# Patient Record
Sex: Male | Born: 2000 | Race: White | Hispanic: No | Marital: Single | Smoking: Never smoker
Health system: Southern US, Community
[De-identification: ages and names within clinical notes are randomized; demographics above are authoritative.]

---
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
## 2022-04-11 ENCOUNTER — Ambulatory Visit: Payer: BC Managed Care – PPO | Admitting: Family Medicine

## 2022-04-11 ENCOUNTER — Encounter: Payer: Self-pay | Admitting: Family Medicine

## 2022-04-11 VITALS — BP 110/78 | HR 78 | Temp 97.6°F | Ht 67.5 in | Wt 134.2 lb

## 2022-04-11 DIAGNOSIS — F1210 Cannabis abuse, uncomplicated: Secondary | ICD-10-CM

## 2022-04-11 DIAGNOSIS — F321 Major depressive disorder, single episode, moderate: Secondary | ICD-10-CM | POA: Diagnosis not present

## 2022-04-11 DIAGNOSIS — R197 Diarrhea, unspecified: Secondary | ICD-10-CM | POA: Insufficient documentation

## 2022-04-11 DIAGNOSIS — R45851 Suicidal ideations: Secondary | ICD-10-CM | POA: Diagnosis not present

## 2022-04-11 DIAGNOSIS — M0840 Pauciarticular juvenile rheumatoid arthritis, unspecified site: Secondary | ICD-10-CM

## 2022-04-11 DIAGNOSIS — F909 Attention-deficit hyperactivity disorder, unspecified type: Secondary | ICD-10-CM | POA: Diagnosis not present

## 2022-04-11 NOTE — Assessment & Plan Note (Signed)
Intermittant, with some weight loss No other red flag symptoms Cannot rule out depression as cause of weight loss Recommend patient follow up in 3 months to check weight and assess symptoms

## 2022-04-11 NOTE — Assessment & Plan Note (Signed)
Passive Safety discussed with father  I provided these resources: - National suicide hotline (703) 227-1089) - [place] (1-800-SUICIDE)  Reminded patient our clinic has doctors available for phone contact 24/7  Offered referral to behavioral health, however patient's sister sees a therapist, he wishes to establish with them and declined referral at this time    Father present and says that he will continue to monitor patient and help coordinate care.

## 2022-04-11 NOTE — Assessment & Plan Note (Signed)
Planning to establish with therapy Follow up in 3 montsh

## 2022-04-11 NOTE — Patient Instructions (Signed)
When you're going through tough times, it's easy to feel lonely and overwhelmed. Remember that YOU ARE NOT ALONE and we at [facility] want to support you during this difficult time.  There are many ways to get help and support when you are ready.  You can call the following help lines: - National suicide hotline (386)246-2244) - [place] (1-800-SUICIDE)  You can schedule an appointment with your primary care doc or one of our counselors.  We are all here to support you.  A doctor is on call 24/7, so call us if you need to at [phone]).  There are many treatments that can help people during difficult times, and we can talk with you about medications, counseling, diet, and other options. We want to offer you HOPE that it won't always feel this bad.  If you are seriously thinking about hurting yourself or have a plan, please call 911 or go to any emergency room right away for immediate help.

## 2022-04-11 NOTE — Progress Notes (Signed)
Assessment/Plan:  Spent 46 minutes reviewing patient's chart and discussing care plan with patient and father Problem List Items Addressed This Visit       Musculoskeletal and Integument   JIA (juvenile idiopathic arthritis), oligoarthritis, persistent (HCC)     Other   Depression, major, single episode, moderate ([place])    Planning to establish with therapy Follow up in 3 montsh      Suicidal ideation    Passive Safety discussed with father  I provided these resources: - National suicide hotline 912-283-0301) - [place] (1-800-SUICIDE)  Reminded patient our clinic has doctors available for phone contact 24/7  Offered referral to behavioral health, however patient's sister sees a therapist, he wishes to establish with them and declined referral at this time    Father present and says that he will continue to monitor patient and help coordinate care.       Diarrhea    Intermittant, with some weight loss No other red flag symptoms Cannot rule out depression as cause of weight loss Recommend patient follow up in 3 months to check weight and assess symptoms       Other Visit Diagnoses     Tetrahydrocannabinol (THC) use disorder, mild, abuse    -  Primary   Attention deficit hyperactivity disorder (ADHD), unspecified ADHD type              Subjective:  HPI:  Dominic Cowan is a 21 y.o. male who has JIA (juvenile idiopathic arthritis), oligoarthritis, persistent ([place]); High risk medication use; Depression, major, single episode, moderate ([place]); Suicidal ideation; and Diarrhea on their problem list..   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 Patient also gets constant cramps as well. ) .  Patient is here with father.  Both patient and son provide history. Patient has history of JIA.  Has  been evaluated by pediatric rheumatology.  Last seen by [facility] in 2022.  He has been on methotrexate and other immune modulators in the past.  He has not had a flare in over a year.  He has not been on any medication during this time.  Does not have any ongoing rashes, joint pain, fevers at this time. Lost 11 lbs over past 3 months,   Patient reports that he has been outside at a lot recently.  Most recently pressure.  The event was very hot.  Patient did try to eat and drink appropriately.  However he did develop some lower leg cramps.  He has not going at the moment.  These are worse when he is outside in the heat.   Father relates that patient has longstanding history of history of stomach issues.and mostly presents as intermittent diarrhea when eating. Patient does tolerated p.o. well.  Although father reports him as a lifelong "light" eater .  At this time he does not have any abdominal pain, no nausea vomiting, very active,   Patient has history of ADHD and what he believes is undiagnosed bipolar.  He does report some Anhydonia.and not wanting to do things as much as he used to.  Father does report that he does not bathe daily.  But he will try himself places.       04/11/2022    1:46 PM  Depression screen PHQ 2/9  Decreased Interest 1  Down, Depressed, Hopeless 1  PHQ - 2 Score 2  Altered sleeping 0  Tired, decreased energy 1  Change in appetite 0  Feeling bad or failure about yourself  2  Trouble concentrating 1  Moving slowly or fidgety/restless 1  Suicidal thoughts 2  PHQ-9 Score 9  Difficult doing work/chores Somewhat difficult      04/11/2022    1:50 PM  GAD 7 : Generalized Anxiety Score  Nervous, Anxious, on Edge 1  Control/stop worrying 1  Worry too much - different things 0  Trouble relaxing 1  Restless 0  Easily annoyed or irritable 1  Afraid - awful might happen 3  Total GAD 7 Score 7  Anxiety Difficulty Not difficult at all   Suicide Assessment  Plan: - How  specific is the plan: no plan - How lethal are the means: gun, father reports that he will confiscated firearm and put it in a locked gun case for patient does not have access - Does the patient have access to the means: fire arm  - Does the patient have social support: friends and family    Protective factors (what has kept the patient from self-harm thus far): Family and girlfriend  Substance use / abuse:  THC vaping, 62m cart 1.5 month, some alcohol use, reports that its minor such as wine coolers  Presence of hallucinations / delusions: none  History of SI / Attempts: none  Family history of attempted or completed suicide: none  Duration and Intensity of SI:  1 year, initially About a Year Ago , then had a long interlude several months without any thoughts, then they returned over the past Few Weeks  History of prior psychiatric hospitalizations: none  Chart review for additional risk factors (cite chronic pain, insomnia, panic attacks, age, gender, if present):   Coping mechanisms: THC   How likely are you to act on these thoughts of hurting yourself or ending your life sometime over the next month? NOT LIKELY AT ALL  If somewhat or very likely, consider hospitalization.  Consult suicide protocol if you have not yet done so.  Based on this assessment:    I provided these resources: - National suicide hotline ((518) 549-3326 - [employer](1-800-SUICIDE)  Reminded patient our clinic has doctors available for phone contact 24/7  Offered referral to behavioral health, however patient's sister sees a therapist, he wishes to establish with them and declined referral at this time    Father present and says that he will continue to monitor patient and help coordinate care.   Patient states that he has a history of ADHD.  He was diagno he is not currently medicated at this time.  Sed as a child.  He has been medicated in the past but methylphenidate and  Adderall.however these were discontinued due to significant mood swings and appetite suppression.  History reviewed. No pertinent surgical history.  No outpatient medications prior to visit.   No facility-administered medications prior to visit.    Family History  Problem Relation Age of Onset   Hypertension Mother     Social History   Socioeconomic History   Marital status: Single    Spouse name: Not on file   Number of children: Not on file   Years of education: Not on file   Highest education level: Not on file  Occupational History   Not on file  Tobacco Use   Smoking status: Never    Passive exposure: Never   Smokeless tobacco: Never  Vaping Use  Vaping Use: Some days  Substance and Sexual Activity   Alcohol use: Never   Drug use: Never   Sexual activity: Not on file  Other Topics Concern   Not on file  Social History Narrative   Not on file   Social Determinants of Health   Financial Resource Strain: Not on file  Food Insecurity: Not on file  Transportation Needs: Not on file  Physical Activity: Not on file  Stress: Not on file  Social Connections: Not on file  Intimate Partner Violence: Not on file                                                                                                 Objective:  Physical Exam: BP 110/78 (BP Location: Left Arm, Patient Position: Sitting, Cuff Size: Large)   Pulse 78   Temp 97.6 F (36.4 C) (Temporal)   Ht 5' 7.5" (1.715 m)   Wt 134 lb 3.2 oz (60.9 kg)   BMI 20.71 kg/m    General: No acute distress. Awake and conversant.  Eyes: Normal conjunctiva, anicteric. Round symmetric pupils.  ENT: Hearing grossly intact. No nasal discharge.  Neck: Neck is supple. No masses or thyromegaly.  Respiratory: Respirations are non-labored. No auditory wheezing.  Skin: Warm. No rashes or ulcers.  Psych: Alert and oriented. Cooperative,  Normal judgment.  CV: No cyanosis or JVD MSK: Normal ambulation. No clubbing   Neuro: Sensation and CN II-XII grossly normal.        Alesia Banda, MD, MS

## 2022-04-14 ENCOUNTER — Telehealth: Payer: Self-pay | Admitting: Family Medicine

## 2022-04-14 DIAGNOSIS — F321 Major depressive disorder, single episode, moderate: Secondary | ICD-10-CM

## 2022-04-14 NOTE — Telephone Encounter (Signed)
Contacted Elder Negus, patient's father, he would like a referral for a therapist for [place] stated the therapist they had in mind didn't work out.

## 2022-04-14 NOTE — Telephone Encounter (Signed)
Dominic Cowan about the referral process and that someone from that office will contact them about scheduling.

## 2022-04-14 NOTE — Telephone Encounter (Signed)
Pt  father would like you to call him @ [phone] and also would like you to get pt set up to see a therapist that ya'll talked about

## 2022-07-14 ENCOUNTER — Ambulatory Visit: Payer: BC Managed Care – PPO | Admitting: Family Medicine

## 2022-07-14 ENCOUNTER — Encounter: Payer: Self-pay | Admitting: Family Medicine

## 2022-07-14 ENCOUNTER — Ambulatory Visit (INDEPENDENT_AMBULATORY_CARE_PROVIDER_SITE_OTHER): Payer: BC Managed Care – PPO

## 2022-07-14 VITALS — BP 114/74 | HR 77 | Temp 97.0°F | Wt 128.0 lb

## 2022-07-14 DIAGNOSIS — R110 Nausea: Secondary | ICD-10-CM | POA: Diagnosis not present

## 2022-07-14 DIAGNOSIS — E162 Hypoglycemia, unspecified: Secondary | ICD-10-CM

## 2022-07-14 DIAGNOSIS — R634 Abnormal weight loss: Secondary | ICD-10-CM | POA: Diagnosis not present

## 2022-07-14 DIAGNOSIS — M0840 Pauciarticular juvenile rheumatoid arthritis, unspecified site: Secondary | ICD-10-CM | POA: Diagnosis not present

## 2022-07-14 DIAGNOSIS — R197 Diarrhea, unspecified: Secondary | ICD-10-CM

## 2022-07-14 DIAGNOSIS — F321 Major depressive disorder, single episode, moderate: Secondary | ICD-10-CM

## 2022-07-14 DIAGNOSIS — R45851 Suicidal ideations: Secondary | ICD-10-CM

## 2022-07-14 DIAGNOSIS — E876 Hypokalemia: Secondary | ICD-10-CM

## 2022-07-14 LAB — COMPREHENSIVE METABOLIC PANEL
ALT: 12 U/L (ref 0–53)
AST: 13 U/L (ref 0–37)
Albumin: 4.9 g/dL (ref 3.5–5.2)
Alkaline Phosphatase: 63 U/L (ref 39–117)
BUN: 13 mg/dL (ref 6–23)
CO2: 28 mEq/L (ref 19–32)
Calcium: 9.7 mg/dL (ref 8.4–10.5)
Chloride: 103 mEq/L (ref 96–112)
Creatinine, Ser: 0.77 mg/dL (ref 0.40–1.50)
GFR: 129.16 mL/min (ref >60.00)
Glucose, Bld: 61 mg/dL — ABNORMAL LOW (ref 70–99)
Potassium: 3.3 mEq/L — ABNORMAL LOW (ref 3.5–5.1)
Sodium: 140 mEq/L (ref 135–145)
Total Bilirubin: 0.6 mg/dL (ref 0.2–1.2)
Total Protein: 7.8 g/dL (ref 6.0–8.3)

## 2022-07-14 LAB — CBC WITH DIFFERENTIAL/PLATELET
Basophils Absolute: 0.1 10*3/uL (ref 0.0–0.1)
Basophils Relative: 1.9 % (ref 0.0–3.0)
Eosinophils Absolute: 0.1 10*3/uL (ref 0.0–0.7)
Eosinophils Relative: 1.4 % (ref 0.0–5.0)
HCT: 43.6 % (ref 39.0–52.0)
Hemoglobin: 15.1 g/dL (ref 13.0–17.0)
Lymphocytes Relative: 32.6 % (ref 12.0–46.0)
Lymphs Abs: 1.2 10*3/uL (ref 0.7–4.0)
MCHC: 34.7 g/dL (ref 30.0–36.0)
MCV: 88.8 fl (ref 78.0–100.0)
Monocytes Absolute: 0.4 10*3/uL (ref 0.1–1.0)
Monocytes Relative: 11.6 % (ref 3.0–12.0)
Neutro Abs: 2 10*3/uL (ref 1.4–7.7)
Neutrophils Relative %: 52.5 % (ref 43.0–77.0)
Platelets: 211 10*3/uL (ref 150.0–400.0)
RBC: 4.91 Mil/uL (ref 4.22–5.81)
RDW: 12.8 % (ref 11.5–14.6)
WBC: 3.7 10*3/uL — ABNORMAL LOW (ref 4.5–10.5)

## 2022-07-14 LAB — SEDIMENTATION RATE: Sed Rate: 3 mm/hr (ref 0–15)

## 2022-07-14 LAB — TSH: TSH: 0.95 u[IU]/mL (ref 0.35–5.50)

## 2022-07-14 LAB — C-REACTIVE PROTEIN: CRP: <1 mg/dL (ref 0.5–20.0)

## 2022-07-14 MED ORDER — ESCITALOPRAM OXALATE 10 MG PO TABS: 5.0000 mg | ORAL_TABLET | Freq: Every day | ORAL | 0 refills | Status: DC

## 2022-07-14 MED ORDER — BOOST VERY HIGH CALORIE PO LIQD: 1.0000 | Freq: Every day | ORAL | Status: AC

## 2022-07-14 NOTE — Assessment & Plan Note (Signed)
Unintentional weight loss in the setting of depression and ongoing diarrhea Possibly multifactorial with mood and/or underlying physiologic process with co-occurring Of note, diarrhea is improving, but did initiate with exposure to horse at a farm show.  Possibly indicating infectious etiology. Patient also has a history of JIA, currently asymptomatic, but could have associated underlying inflammatory processes or co-occurring IBD Plan to treat depression with escitalopram low-dose 5 mg Will work-up for organic causes of weight loss including endocrine dysfunction, inflammatory causes, infectious/GI etiology Will refer to gastroenterology given ongoing symptoms with diarrhea and weight loss Encourage meal supplementation with high calorie shakes Return in about 4 weeks (around 08/11/2022) for depression and anxiety and weight loss.

## 2022-07-14 NOTE — Assessment & Plan Note (Signed)
Associated with GAD Reports better, especially regarding passive suicidal ideation , But PHQ-9/GAD-7 are both elevated Patient denies any medication history regarding anxiety or depression. Encourage patient follow-up with counseling, Will try low-dose escitalopram

## 2022-07-14 NOTE — Progress Notes (Signed)
Assessment/Plan:   Problem List Items Addressed This Visit       Musculoskeletal and Integument   JIA (juvenile idiopathic arthritis), oligoarthritis, persistent (HCC)   Relevant Orders   Sedimentation rate   C-reactive protein     Other   Depression, major, single episode, moderate (HCC)    Associated with GAD Reports better, especially regarding passive suicidal ideation , But PHQ-9/GAD-7 are both elevated Patient denies any medication history regarding anxiety or depression. Encourage patient follow-up with counseling, Will try low-dose escitalopram       Relevant Medications   escitalopram (LEXAPRO) 10 MG tablet   Suicidal ideation   Relevant Medications   escitalopram (LEXAPRO) 10 MG tablet   Diarrhea   Relevant Orders   TSH   CBC w/Diff   Sedimentation rate   C-reactive protein   Comp Met (CMET)   Gliadin antibodies, serum   Tissue transglutaminase, IgA   Reticulin Antibody, IgA w reflex titer   Ambulatory referral to Gastroenterology   Fecal occult blood, imunochemical   GI Profile, Stool, PCR   Unintentional weight loss - Primary    Unintentional weight loss in the setting of depression and ongoing diarrhea Possibly multifactorial with mood and/or underlying physiologic process with co-occurring Of note, diarrhea is improving, but did initiate with exposure to horse at a farm show.  Possibly indicating infectious etiology. Patient also has a history of JIA, currently asymptomatic, but could have associated underlying inflammatory processes or co-occurring IBD Plan to treat depression with escitalopram low-dose 5 mg Will work-up for organic causes of weight loss including endocrine dysfunction, inflammatory causes, infectious/GI etiology Will refer to gastroenterology given ongoing symptoms with diarrhea and weight loss Encourage meal supplementation with high calorie shakes Return in about 4 weeks (around 08/11/2022) for depression and anxiety and weight  loss.       Relevant Medications   Nutritional Supplements (BOOST VERY HIGH CALORIE) LIQD   Other Relevant Orders   TSH   CBC w/Diff   Sedimentation rate   C-reactive protein   Urinalysis, Routine w reflex microscopic   Comp Met (CMET)   Gliadin antibodies, serum   Tissue transglutaminase, IgA   Reticulin Antibody, IgA w reflex titer   Ambulatory referral to Gastroenterology   HIV antibody (with reflex)   DG Chest 2 View   Fecal occult blood, imunochemical   GI Profile, Stool, PCR   Lactate dehydrogenase   Other Visit Diagnoses     Nausea       Relevant Orders   Comp Met (CMET)   Gliadin antibodies, serum   Fecal occult blood, imunochemical          Subjective:  HPI:  Dominic Cowan is a 21 y.o. male who has JIA (juvenile idiopathic arthritis), oligoarthritis, persistent ([place]); Depression, major, single episode, moderate ([place]); Suicidal ideation; Diarrhea; and Unintentional weight loss on their problem list..   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 Cramping has gotten better. ) .   Patient's father is with him here who provides concern for ongoing mental health issues and weight loss.  Unintentional weight loss.  Patient reports that he is at ongoing weight loss at home.  He has lost approximately 6 pounds since last visit.  Reports that he is working and "forgets" to eat while he is at work.  Reports that he sometimes  gets nausea usually right before or around eating.  He reports that he has had diarrhea intermittently at home without blood in the stool.  Reports that his mood is also improved  Diarrhea.  Patient reports about 3 months of diarrhea associated with generalized abdominal cramping.  Reports that it is greatly improved.  He states that it started when went a horse show with his girlfriend.,  He denies  any blood in the stool, fevers, chills.  JIA.  Patient has a history of JIA.  Has not had symptoms since 18.  He is not currently medicated.  Denies any rash or fevers.  Depression/Anxiety, established problem,  Current Medications: None Current Symptoms/Interim History: Patient reports that overall he feels like his mood is improved.  He has not established with any counseling services as he says he has not "found the right fit".  Father states that he is encouraged his son to eat and to pursue counseling.  He is wanting to be contacted first regarding patient's ongoing care.  Did discuss with father that when Sharing Patient Information, the Law Mandates That We Go through, Treatments and Patient Can Then Further Share Information.  Father Does Report That Patient Has Been Getting His Health Information and Intermittently Sharing with Him.    07/14/2022    9:36 AM 04/11/2022    1:46 PM  Depression screen PHQ 2/9  Decreased Interest 1 1  Down, Depressed, Hopeless 1 1  PHQ - 2 Score 2 2  Altered sleeping 1 0  Tired, decreased energy 1 1  Change in appetite 2 0  Feeling bad or failure about yourself  2 2  Trouble concentrating 1 1  Moving slowly or fidgety/restless 2 1  Suicidal thoughts 1 2  PHQ-9 Score 12 9  Difficult doing work/chores Somewhat difficult Somewhat difficult      07/14/2022    9:36 AM 04/11/2022    1:50 PM  GAD 7 : Generalized Anxiety Score  Nervous, Anxious, on Edge 1 1  Control/stop worrying 2 1  Worry too much - different things 2 0  Trouble relaxing 1 1  Restless 2 0  Easily annoyed or irritable 1 1  Afraid - awful might happen 3 3  Total GAD 7 Score 12 7  Anxiety Difficulty Somewhat difficult Not difficult at all   ROS: Patient has passive SI today.  Reports that it is better.  Reports that he has no plan to commit suicide.  Denies HI.   History reviewed. No pertinent surgical history.  No outpatient medications prior to visit.   No  facility-administered medications prior to visit.    Family History  Problem Relation Age of Onset   Hypertension Mother     Social History   Socioeconomic History   Marital status: Single    Spouse name: Not on file   Number of children: Not on file   Years of education: Not on file   Highest education level: Not on file  Occupational History   Not on file  Tobacco Use   Smoking status: Never    Passive exposure: Never   Smokeless tobacco: Never  Vaping Use   Vaping Use: Some days  Substance and Sexual Activity   Alcohol use: Never   Drug use: Never   Sexual activity: Not on file  Other Topics Concern   Not on file  Social History Narrative   Not on file   Social Determinants of Health   Financial Resource Strain: Not on file  Food Insecurity: Not on file  Transportation Needs: Not on file  Physical Activity: Not on file  Stress: Not on file  Social Connections: Not on file  Intimate Partner Violence: Not on file                                                                                                 Objective:  Physical Exam: BP 114/74 (BP Location: Left Arm, Patient Position: Sitting, Cuff Size: Large)   Pulse 77   Temp (!) 97 F (36.1 C) (Temporal)   Wt 128 lb (58.1 kg)   SpO2 99%   BMI 19.75 kg/m    General: No acute distress. Awake and conversant.  Eyes: Normal conjunctiva, anicteric. Round symmetric pupils.  ENT: Hearing grossly intact. No nasal discharge.  Neck: Neck is supple. No masses or thyromegaly.  Respiratory: Respirations are non-labored. No auditory wheezing.  CTA B Skin: Warm. No rashes or ulcers.  Psych: Alert and oriented. Cooperative, Appropriate mood and affect, Normal judgment.  CV: No cyanosis or JVD, RRR, no MRG ABD: Nontender nondistended MSK: Normal ambulation. No clubbing  Neuro: Sensation and CN II-XII grossly normal.        Alesia Banda, MD, MS

## 2022-07-14 NOTE — Progress Notes (Unsigned)
Initial visit for unintentional weight loss with diarrhea.

## 2022-07-14 NOTE — Patient Instructions (Addendum)
For weight loss with diarrhea, we are getting blood work, a chest xray, and referring to gastroenterology. Start using meal supplement shakes daily. Check your weight a few times a weekly keep record.  For depression, we are starting escitalopram. Consider establishing with a therapist. Check your we

## 2022-07-15 LAB — LACTATE DEHYDROGENASE: LDH: 111 U/L (ref 100–220)

## 2022-07-16 LAB — HIV ANTIBODY (ROUTINE TESTING W REFLEX): HIV 1&2 Ab, 4th Generation: NONREACTIVE

## 2022-07-16 LAB — TISSUE TRANSGLUTAMINASE, IGA: (tTG) Ab, IgA: <1 U/mL

## 2022-07-16 LAB — GLIADIN ANTIBODIES, SERUM
Gliadin IgA: 22.6 U/mL — ABNORMAL HIGH
Gliadin IgG: <1 U/mL

## 2022-07-16 LAB — RETICULIN ANTIBODIES, IGA W TITER: Reticulin Ab, IgA: NEGATIVE titer (ref <2.5)

## 2022-07-16 NOTE — Addendum Note (Signed)
Addended by: Beryle Lathe S on: 07/16/2022 03:46 PM   Modules accepted: Orders

## 2022-08-09 ENCOUNTER — Other Ambulatory Visit: Payer: Self-pay | Admitting: Family Medicine

## 2022-08-09 DIAGNOSIS — F321 Major depressive disorder, single episode, moderate: Secondary | ICD-10-CM

## 2022-08-09 DIAGNOSIS — R45851 Suicidal ideations: Secondary | ICD-10-CM

## 2022-08-10 ENCOUNTER — Other Ambulatory Visit: Payer: Self-pay | Admitting: Family Medicine

## 2022-08-10 DIAGNOSIS — F321 Major depressive disorder, single episode, moderate: Secondary | ICD-10-CM

## 2022-08-10 DIAGNOSIS — R45851 Suicidal ideations: Secondary | ICD-10-CM

## 2022-08-11 ENCOUNTER — Other Ambulatory Visit: Payer: Self-pay | Admitting: Family Medicine

## 2022-08-11 DIAGNOSIS — R45851 Suicidal ideations: Secondary | ICD-10-CM

## 2022-08-11 DIAGNOSIS — F321 Major depressive disorder, single episode, moderate: Secondary | ICD-10-CM

## 2022-08-11 MED ORDER — ESCITALOPRAM OXALATE 10 MG PO TABS: 5.0000 mg | ORAL_TABLET | Freq: Every day | ORAL | 0 refills | Status: DC

## 2022-08-12 ENCOUNTER — Telehealth: Payer: Self-pay | Admitting: Family Medicine

## 2022-08-12 NOTE — Telephone Encounter (Signed)
error 

## 2022-08-13 ENCOUNTER — Telehealth: Payer: BC Managed Care – PPO | Admitting: Family Medicine

## 2022-08-15 ENCOUNTER — Telehealth: Payer: Self-pay | Admitting: Family Medicine

## 2022-08-19 NOTE — Telephone Encounter (Signed)
error 

## 2022-08-21 ENCOUNTER — Telehealth: Payer: BC Managed Care – PPO | Admitting: Family Medicine

## 2022-08-22 ENCOUNTER — Telehealth (INDEPENDENT_AMBULATORY_CARE_PROVIDER_SITE_OTHER): Payer: BC Managed Care – PPO | Admitting: Family Medicine

## 2022-08-22 DIAGNOSIS — K900 Celiac disease: Secondary | ICD-10-CM

## 2022-08-22 DIAGNOSIS — F321 Major depressive disorder, single episode, moderate: Secondary | ICD-10-CM

## 2022-08-22 DIAGNOSIS — R45851 Suicidal ideations: Secondary | ICD-10-CM

## 2022-08-22 MED ORDER — ESCITALOPRAM OXALATE 10 MG PO TABS: 5.0000 mg | ORAL_TABLET | Freq: Every day | ORAL | 0 refills | Status: DC

## 2022-08-22 NOTE — Progress Notes (Signed)
Virtual Visit via Video Note  I connected with Dominic Cowan on 08/30/22 at  8:20 AM EST by a video enabled telemedicine application and verified that I am speaking with the correct person using two identifiers.  Location: Patient: Home Provider: Office   I discussed the limitations of evaluation and management by telemedicine and the availability of in person appointments. The patient expressed understanding and agreed to proceed.  History of Present Illness: Chief Complaint  Patient presents with   PHQ/GAD    Follow up on Phq/GAD. No concerns     Depression, Follow-up  Patient follow-up for depression.  Patient was started on escitalopram 5 mg.  He is tolerating this well.  He reports improvement in symptoms. Patient also with recent diagnosis of celiac disease.  He is having improvement in symptoms.    08/22/2022    8:18 AM 07/14/2022    9:36 AM 04/11/2022    1:46 PM  Depression screen PHQ 2/9  Decreased Interest 1 1 1   Down, Depressed, Hopeless 1 1 1   PHQ - 2 Score 2 2 2   Altered sleeping 0 1 0  Tired, decreased energy 1 1 1   Change in appetite 1 2 0  Feeling bad or failure about yourself  2 2 2   Trouble concentrating 0 1 1  Moving slowly or fidgety/restless 1 2 1   Suicidal thoughts 0 1 2  PHQ-9 Score 7 12 9   Difficult doing work/chores Not difficult at all Somewhat difficult Somewhat difficult    -----------------------------------------------------------------------------------------    Observations/Objective: Gen: NAD, resting comfortably HEENT: EOMI Pulm: NWOB Skin: no rash on face Neuro: no facial asymmetry or dysmetria Psych: Normal affect   Assessment and Plan: Problem List Items Addressed This Visit       Other   Depression, major, single episode, moderate (HCC)   Relevant Medications   escitalopram (LEXAPRO) 10 MG tablet   Other Visit Diagnoses     Celiac disease    -  Primary      Depression well-controlled.  We will continue  escitalopram 5 mg. Return in about 3 months (around 11/21/2022).   Follow Up Instructions:    I discussed the assessment and treatment plan with the patient. The patient was provided an opportunity to ask questions and all were answered. The patient agreed with the plan and demonstrated an understanding of the instructions.   The patient was advised to call back or seek an in-person evaluation if the symptoms worsen or if the condition fails to improve as anticipated.  I provided 20 minutes of non-face-to-face time during this encounter.   Bonnita Hollow, MD

## 2022-08-22 NOTE — Patient Instructions (Signed)
[facility] Located in: [place] Address: [address] [place], [place], [place] [address]  Phone: [phone]

## 2022-09-10 ENCOUNTER — Other Ambulatory Visit: Payer: Self-pay | Admitting: Family Medicine

## 2022-09-10 DIAGNOSIS — F321 Major depressive disorder, single episode, moderate: Secondary | ICD-10-CM

## 2022-10-13 ENCOUNTER — Encounter: Payer: Self-pay | Admitting: Internal Medicine

## 2022-11-26 ENCOUNTER — Ambulatory Visit: Payer: BC Managed Care – PPO | Admitting: Internal Medicine

## 2022-12-09 ENCOUNTER — Other Ambulatory Visit: Payer: Self-pay | Admitting: Family Medicine

## 2022-12-09 DIAGNOSIS — F321 Major depressive disorder, single episode, moderate: Secondary | ICD-10-CM

## 2022-12-09 NOTE — Telephone Encounter (Signed)
Chart supports rx. Last OV: 08/22/2022 Next OV: 12/10/2022

## 2022-12-10 ENCOUNTER — Telehealth (INDEPENDENT_AMBULATORY_CARE_PROVIDER_SITE_OTHER): Payer: BC Managed Care – PPO | Admitting: Family Medicine

## 2022-12-10 DIAGNOSIS — K900 Celiac disease: Secondary | ICD-10-CM | POA: Insufficient documentation

## 2022-12-10 DIAGNOSIS — R45851 Suicidal ideations: Secondary | ICD-10-CM | POA: Diagnosis not present

## 2022-12-10 DIAGNOSIS — F321 Major depressive disorder, single episode, moderate: Secondary | ICD-10-CM

## 2022-12-10 DIAGNOSIS — F419 Anxiety disorder, unspecified: Secondary | ICD-10-CM | POA: Insufficient documentation

## 2022-12-10 MED ORDER — ESCITALOPRAM OXALATE 10 MG PO TABS: 5.0000 mg | ORAL_TABLET | Freq: Every day | ORAL | 1 refills | Status: DC

## 2022-12-10 NOTE — Patient Instructions (Signed)
For urgent psychiatric assistance you can go to   [facility] Phone: [phone]  [address] [place], [place] [address]  Hours: Open 24/7, No appointment required.  [facility]  [address] [place], [place] [address] Phone: [phone]  When you're going through tough times, it's easy to feel lonely and overwhelmed. Remember that YOU ARE NOT ALONE and we at [facility] want to support you during this difficult time.  There are many ways to get help and support when you are ready.  You can call the following help lines: - National suicide hotline (936-561-2503) - National Hopeline Network (1-800-SUICIDE)   You can schedule an appointment with a team member with your primary care doc or one of our counselors.  We are all here to support you.  A doctor is on call 24/7   There are many treatments that can help people during difficult times, and we can talk with you about medications, counseling, diet, and other options. We want to offer you HOPE that it won't always feel this bad.   If you are seriously thinking about hurting yourself or have a plan, please call 911 or go to any emergency room right away for immediate help.

## 2022-12-10 NOTE — Progress Notes (Signed)
Virtual Visit via Video Note  I connected with Dominic Cowan on 12/10/2022 at  8:00 AM EDT by a video enabled telemedicine application and verified that I am speaking with the correct person using two identifiers.  Location: Patient: home Provider: Office   I discussed the limitations of evaluation and management by telemedicine and the availability of in person appointments. The patient expressed understanding and agreed to proceed.  History of Present Illness:  Chief Complaint  Patient presents with   PHQ/GAD   Depression/ Anxiety: The patient presented for follow-up on anxiety and reported improvement.  Tolerating without side effects.  Suicidal Ideation: The patient reported intermittent, persistent suicidal ideations without a specified plan. Ensured patient has a safe environment.   Observations/Objective: There were no vitals filed for this visit.   Gen: NAD, resting comfortably HEENT: EOMI Pulm: NWOB Skin: no rash on face Neuro: no facial asymmetry or dysmetria Psych: Normal affect   Assessment and Plan: Problem List Items Addressed This Visit       Digestive   Celiac disease     Other   Depression, major, single episode, moderate    Refill escitalopram (LEXAPRO) 10 MG tablet for a duration of 3 months. Continue online counseling with [facility] or seek in-person therapy if preferred. Community resources were provided via MyChart for support with suicidal thoughts, if needed. Follow up with gastroenterology regarding presumed celiac disease; patient reports improvement on a low gluten diet. Encouraged outdoor activities and the maintenance of regular life routines for mental well-being. The patient was advised to call back or seek an in-person evaluation if symptoms worsen or the condition fails to improve as anticipated. Schedule a follow-up in 3 months, with the option for a virtual appointment.      Relevant Medications   escitalopram (LEXAPRO) 10  MG tablet   Suicidal ideation - Primary   Anxiety   Relevant Medications   escitalopram (LEXAPRO) 10 MG tablet    Medications Discontinued During This Encounter  Medication Reason   escitalopram (LEXAPRO) 10 MG tablet Reorder     Follow Up Instructions: Return in about 3 months (around 03/11/2023).    I discussed the assessment and treatment plan with the patient. The patient was provided an opportunity to ask questions and all were answered. The patient agreed with the plan and demonstrated an understanding of the instructions.   The patient was advised to call back or seek an in-person evaluation if the symptoms worsen or if the condition fails to improve as anticipated. Garnette Gunner, MD

## 2022-12-10 NOTE — Assessment & Plan Note (Addendum)
Refill escitalopram (LEXAPRO) 10 MG tablet for a duration of 3 months. Continue online counseling with [facility] or seek in-person therapy if preferred. Community resources were provided via MyChart for support with suicidal thoughts, if needed. Follow up with gastroenterology regarding presumed celiac disease; patient reports improvement on a low gluten diet. Encouraged outdoor activities and the maintenance of regular life routines for mental well-being. The patient was advised to call back or seek an in-person evaluation if symptoms worsen or the condition fails to improve as anticipated. Schedule a follow-up in 3 months, with the option for a virtual appointment.

## 2023-03-20 ENCOUNTER — Encounter: Payer: Self-pay | Admitting: Internal Medicine

## 2023-03-20 ENCOUNTER — Ambulatory Visit: Payer: BC Managed Care – PPO | Admitting: Internal Medicine

## 2023-03-20 VITALS — BP 102/64 | HR 77 | Ht 67.0 in | Wt 124.0 lb

## 2023-03-20 DIAGNOSIS — K588 Other irritable bowel syndrome: Secondary | ICD-10-CM

## 2023-03-20 DIAGNOSIS — R194 Change in bowel habit: Secondary | ICD-10-CM | POA: Diagnosis not present

## 2023-03-20 DIAGNOSIS — R899 Unspecified abnormal finding in specimens from other organs, systems and tissues: Secondary | ICD-10-CM

## 2023-03-20 NOTE — Patient Instructions (Signed)
_______________________________________________________  If your blood pressure at your visit was 140/90 or greater, please contact your primary care physician to follow up on this.  _______________________________________________________  If you are age 21 or older, your body mass index should be between 23-30. Your Body mass index is 19.42 kg/m. If this is out of the aforementioned range listed, please consider follow up with your Primary Care Provider.  If you are age 81 or younger, your body mass index should be between 19-25. Your Body mass index is 19.42 kg/m. If this is out of the aformentioned range listed, please consider follow up with your Primary Care Provider.   ________________________________________________________  The [place] [facility] providers would like to encourage you to use [facility] to communicate with providers for non-urgent requests or questions.  Due to long hold times on the telephone, sending your provider a message by [facility] may be a faster and more efficient way to get a response.  Please allow 48 business hours for a response.  Please remember that this is for non-urgent requests.  _______________________________________________________  Please follow up on _________________________

## 2023-03-20 NOTE — Progress Notes (Addendum)
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  The patient is new to GI.  He is accompanied today by his father.  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  He reports being in his usual state of health until about 1 year ago when after attending a course show he developed an acute diarrheal illness.  He felt like he had a "stomach bug".  Acute symptoms dissipated.  However thereafter he noticed problems with postprandial abdominal discomfort and diarrhea.  There was urgency.  Problems persisted.  Thus, he was evaluated by his primary care provider November 2023.  Blood work from July 14, 2022 revealed unremarkable comprehensive metabolic panel except for potassium 3.3.  Normal CRP.  Unremarkable CBC with hemoglobin 15.1.  Normal TSH.  All celiac testing, including tissue transglutaminase antibody IgA were negative or normal except for gliadin IgA which was elevated at 22.6.  On that basis he was told that he celiac disease.  The patient is to take gluten-free diet.  At some point thereafter his GI symptoms improved.  Currently he describes 2 or 3 bowel movements per day.  Majority are normal with occasional loose bowel.  No bleeding.  No skin rash.  Weight has been stable.  He does not care for his gluten-free diet.  Recently had a normal pizza and did well without symptoms.  His father has a number of appropriate questions.  REVIEW OF SYSTEMS:  All non-GI ROS negative unless otherwise stated in the HPI except for anxiety, depression  Past Medical History:  Diagnosis Date   High risk medication use 07/08/2016   Juvenile rheumatoid arthritis (HCC) 2006    History reviewed. No pertinent surgical history.  Social History ENZI BARNHARD  reports that he has  never smoked. He has never been exposed to tobacco smoke. He has never used smokeless tobacco. He reports that he does not drink alcohol and does not use drugs.  family history includes Hypertension in his mother.  No Known Allergies     PHYSICAL EXAMINATION: Vital signs: BP 102/64   Pulse 77   Ht 5\' 7"  (1.702 m)   Wt 124 lb (56.2 kg)   BMI 19.42 kg/m   Constitutional: Thin but generally well-appearing, no acute distress Psychiatric: alert and oriented x3, cooperative Eyes: extraocular movements intact, anicteric, conjunctiva pink Mouth: oral pharynx moist, no lesions Neck: supple no lymphadenopathy Cardiovascular: heart regular rate and rhythm, no murmur Lungs: clear to auscultation bilaterally Abdomen: soft, nontender, nondistended, no obvious ascites, no peritoneal signs, normal bowel sounds, no organomegaly Rectal: Omitted Extremities: no clubbing, cyanosis, or lower extremity edema bilaterally Skin: no lesions on visible extremities Neuro: No focal deficits.  Cranial nerves intact  ASSESSMENT:  1.  Acute diarrheal illness followed by postprandial urgency with loose stools.  Most consistent with postinfectious IBS.  Improvement after initiating gluten-free diet.  Question cause-and-effect versus temporally related.  Suspect latter. 2.  Query celiac disease.  Elevated gliadin IgA antibody.  Nonspecific.  Nonsensitive.  Celiac testing otherwise normal. 3.  History of juvenile rheumatoid arthritis.  In remission off medication 4.  Anxiety/depression.  On Lexapro  PLAN:  1.  Long discussion on celiac disease.  Long discussion on my impression as outlined above.  Multiple questions answered. 2.  Recommend  resuming gluten into his diet, to see if there are any clinical effects. 3.  Recommend office follow-up in 3 months to see how he is doing clinically.  If there is any question, at that point, would proceed with upper endoscopy with biopsies. 4.  Contact the office in the  interim for any questions or problems.  They understand and agree. Total time of 45 minutes was spent preparing to see the patient, obtaining comprehensive history, performing medically appropriate physical exam, counseling and educating the patient and his father regarding the above listed issues, directing care plan, arranging follow-up, and documenting clinical information in the health record.

## 2023-06-23 ENCOUNTER — Ambulatory Visit: Payer: BC Managed Care – PPO | Admitting: Internal Medicine

## 2023-06-23 ENCOUNTER — Encounter: Payer: Self-pay | Admitting: Internal Medicine

## 2023-06-23 VITALS — BP 110/70 | HR 64 | Ht 67.0 in | Wt 138.0 lb

## 2023-06-23 DIAGNOSIS — K588 Other irritable bowel syndrome: Secondary | ICD-10-CM | POA: Diagnosis not present

## 2023-06-23 DIAGNOSIS — R194 Change in bowel habit: Secondary | ICD-10-CM | POA: Diagnosis not present

## 2023-06-23 DIAGNOSIS — R899 Unspecified abnormal finding in specimens from other organs, systems and tissues: Secondary | ICD-10-CM | POA: Diagnosis not present

## 2023-06-23 NOTE — Patient Instructions (Signed)
Please follow up in 6 months  _______________________________________________________  If your blood pressure at your visit was 140/90 or greater, please contact your primary care physician to follow up on this.  _______________________________________________________  If you are age 21 or older, your body mass index should be between 23-30. Your Body mass index is 21.61 kg/m. If this is out of the aforementioned range listed, please consider follow up with your Primary Care Provider.  If you are age 66 or younger, your body mass index should be between 19-25. Your Body mass index is 21.61 kg/m. If this is out of the aformentioned range listed, please consider follow up with your Primary Care Provider.   ________________________________________________________  The [place] [facility] providers would like to encourage you to use [facility] to communicate with providers for non-urgent requests or questions.  Due to long hold times on the telephone, sending your provider a message by [facility] may be a faster and more efficient way to get a response.  Please allow 48 business hours for a response.  Please remember that this is for non-urgent requests.  _______________________________________________________

## 2023-06-23 NOTE — Progress Notes (Signed)
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  There was a question of celiac disease based on elevated gliadin IgA antibody.  I felt the patient most likely had postinfectious IBS.  He was told to resume gluten into his diet and follow-up at this time.  He is accompanied by his father.  Patient reports that he is doing well.  Normal bowel habits.  No complaints.  REVIEW OF SYSTEMS:  All non-GI ROS negative. Past Medical History:  Diagnosis Date   High risk medication use 07/08/2016   Juvenile rheumatoid arthritis (HCC) 2006    History reviewed. No pertinent surgical history.  Social History Dominic Cowan  reports that he has never smoked. He has never been exposed to tobacco smoke. He has never used smokeless tobacco. He reports that he does not drink alcohol and does not use drugs.  family history includes Hypertension in his mother.  No Known Allergies     PHYSICAL EXAMINATION: Vital signs: BP 110/70   Pulse 64   Ht 5\' 7"  (1.702 m)   Wt 138 lb (62.6 kg)   BMI 21.61 kg/m   Constitutional: generally well-appearing, no acute distress Psychiatric: alert and oriented x3, cooperative Abdomen: Not reexamined Skin: no lesions on visible extremities Neuro: Nonfocal  ASSESSMENT:  1.  Postinfectious IBS.  Resolved. 2.  Elevated gliadin IgA.  Otherwise normal celiac testing   PLAN:  1.  Continue unrestricted diet 2.  Office follow-up in 6 months.  We can recheck tissue transglutaminase antibody at that time.  Contact the office in the interim for any questions or problems.

## 2024-02-13 ENCOUNTER — Other Ambulatory Visit: Payer: Self-pay | Admitting: Family Medicine

## 2024-02-13 DIAGNOSIS — F321 Major depressive disorder, single episode, moderate: Secondary | ICD-10-CM

## 2024-05-11 ENCOUNTER — Encounter: Payer: Self-pay | Admitting: Family Medicine

## 2024-05-25 ENCOUNTER — Telehealth: Payer: Self-pay

## 2024-05-25 ENCOUNTER — Other Ambulatory Visit: Payer: Self-pay | Admitting: Family Medicine

## 2024-05-25 ENCOUNTER — Telehealth (INDEPENDENT_AMBULATORY_CARE_PROVIDER_SITE_OTHER): Admitting: Family Medicine

## 2024-05-25 DIAGNOSIS — F321 Major depressive disorder, single episode, moderate: Secondary | ICD-10-CM | POA: Diagnosis not present

## 2024-05-25 DIAGNOSIS — R45851 Suicidal ideations: Secondary | ICD-10-CM | POA: Diagnosis not present

## 2024-05-25 DIAGNOSIS — F419 Anxiety disorder, unspecified: Secondary | ICD-10-CM

## 2024-05-25 MED ORDER — ESCITALOPRAM OXALATE 10 MG PO TABS: 10.0000 mg | ORAL_TABLET | Freq: Every day | ORAL | 0 refills | Status: DC

## 2024-05-25 NOTE — Telephone Encounter (Signed)
 Error

## 2024-05-25 NOTE — Telephone Encounter (Signed)
 Copied from CRM #[id number]. Topic: Clinical - Medication Refill >> May 25, 2024 10:33 AM Pinkey ORN wrote: Medication: escitalopram  (LEXAPRO ) 10 MG tablet  Has the patient contacted their pharmacy? Yes (Agent: If no, request that the patient contact the pharmacy for the refill. If patient does not wish to contact the pharmacy document the reason why and proceed with request.) (Agent: If yes, when and what did the pharmacy advise?)  This is the patient's preferred pharmacy:  [id number] [address], [place] - [address] [place] [address] Phone: [phone] Fax: [phone]  Is this the correct pharmacy for this prescription? Yes If no, delete pharmacy and type the correct one.   Has the prescription been filled recently? No  Is the patient out of the medication? No  Has the patient been seen for an appointment in the last year OR does the patient have an upcoming appointment? Yes  Can we respond through MyChart? Yes  Agent: Please be advised that Rx refills may take up to 3 business days. We ask that you follow-up with your pharmacy.

## 2024-05-25 NOTE — Telephone Encounter (Signed)
 Rx was sent on 05/25/2024

## 2024-05-25 NOTE — Progress Notes (Signed)
 Virtual Visit via Video Note  I connected with Elsie CHRISTELLA Curry on 05/25/2024 at  2:00 PM EDT by a video enabled telemedicine application and verified that I am speaking with the correct person using two identifiers.  Location: Patient: home Provider: office   I discussed the limitations of evaluation and management by telemedicine and the availability of in person appointments. The patient expressed understanding and agreed to proceed.    Chief Complaint  Patient presents with   Anxiety    1 year follow up for anxiety and depression. Rx refill for Lexapro  10MG      Discussed the use of AI scribe software for clinical note transcription with the patient, who gave verbal consent to proceed.  History of Present Illness TAJEE SAVANT is a 23 year old male with anxiety and depression who presents for follow-up.  Mood and anxiety symptoms - Anxiety and depression treated with escitalopram  10 mg daily - GAD-7 score is 4 - PHQ-9 score is 11 - Experiences passive suicidal thoughts, specifically feeling 'better off dead', but no intention to harm himself - No side effects from medication, including no insomnia, anorexia, pain, or nausea  Functional status - Engages in work, Optometrist with friends, and participates in fishing, which he enjoys      05/25/2024    1:50 PM 12/10/2022    7:56 AM 08/22/2022    8:18 AM 07/14/2022    9:36 AM 04/11/2022    1:46 PM  Depression screen PHQ 2/9  Decreased Interest 3 1 1 1 1   Down, Depressed, Hopeless 2 1 1 1 1   PHQ - 2 Score 5 2 2 2 2   Altered sleeping 0 0 0 1 0  Tired, decreased energy 0 1 1 1 1   Change in appetite 0 0 1 2 0  Feeling bad or failure about yourself  2 1 2 2 2   Trouble concentrating 1 1 0 1 1  Moving slowly or fidgety/restless 2 0 1 2 1   Suicidal thoughts 1 1 0 1 2  PHQ-9 Score 11 6 7 12 9   Difficult doing work/chores Somewhat difficult Somewhat difficult Not difficult at all Somewhat difficult Somewhat difficult       05/25/2024    1:52 PM 12/10/2022    7:58 AM 08/22/2022    8:20 AM 07/14/2022    9:36 AM  GAD 7 : Generalized Anxiety Score  Nervous, Anxious, on Edge 1 1 1 1   Control/stop worrying 1 2 2 2   Worry too much - different things 0 2 2 2   Trouble relaxing 0 0 0 1  Restless 1 0 0 2  Easily annoyed or irritable 0 1 1 1   Afraid - awful might happen 1 1 1 3   Total GAD 7 Score 4 7 7 12   Anxiety Difficulty Not difficult at all Somewhat difficult Not difficult at all Somewhat difficult   No HI No Active Plan for self harm    Observations/Objective: There were no vitals filed for this visit.  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 Reports passive suicidal ideation with thoughts of being better off dead, but no active plans or intent to harm himself. Currently on escitalopram  10 mg daily, which he tolerates well without side effects. - Refill escitalopram  10 mg  daily for 90 days. - Schedule follow-up appointment in 30 days for in-person evaluation.  Generalized anxiety disorder Well-controlled with a GAD-7 score of 4. Continues on escitalopram  10 mg daily, which is effective in managing symptoms. - Continue escitalopram  10 mg daily.       I discussed the assessment and treatment plan with the patient. The patient was provided an opportunity to ask questions and all were answered. The patient agreed with the plan and demonstrated an understanding of the instructions.   The patient was advised to call back or seek an in-person evaluation if the symptoms worsen or if the condition fails to improve as anticipated.     Beverley KATHEE Hummer, MD

## 2024-07-06 ENCOUNTER — Ambulatory Visit (INDEPENDENT_AMBULATORY_CARE_PROVIDER_SITE_OTHER): Admitting: Family Medicine

## 2024-07-06 ENCOUNTER — Encounter: Payer: Self-pay | Admitting: Family Medicine

## 2024-07-06 VITALS — BP 108/61 | HR 75 | Temp 97.5°F | Resp 18 | Wt 125.4 lb

## 2024-07-06 DIAGNOSIS — R634 Abnormal weight loss: Secondary | ICD-10-CM

## 2024-07-06 DIAGNOSIS — M0840 Pauciarticular juvenile rheumatoid arthritis, unspecified site: Secondary | ICD-10-CM

## 2024-07-06 DIAGNOSIS — K900 Celiac disease: Secondary | ICD-10-CM | POA: Diagnosis not present

## 2024-07-06 DIAGNOSIS — F321 Major depressive disorder, single episode, moderate: Secondary | ICD-10-CM | POA: Diagnosis not present

## 2024-07-06 DIAGNOSIS — Z0000 Encounter for general adult medical examination without abnormal findings: Secondary | ICD-10-CM | POA: Diagnosis not present

## 2024-07-06 DIAGNOSIS — F419 Anxiety disorder, unspecified: Secondary | ICD-10-CM

## 2024-07-06 MED ORDER — ESCITALOPRAM OXALATE 10 MG PO TABS: 10.0000 mg | ORAL_TABLET | Freq: Every day | ORAL | 3 refills | Status: AC

## 2024-07-06 NOTE — Progress Notes (Signed)
 Assessment  Assessment/Plan:   Assessment and Plan Assessment & Plan Adult Wellness Visit Routine adult wellness visit with no acute concerns. BMI is within normal range. Weight loss attributed to irregular meal patterns due to work and social circumstances. No signs of malnutrition or systemic illness. No symptoms suggestive of celiac disease or juvenile idiopathic arthritis flare. - Ordered blood tests including inflammatory markers, blood counts, liver function, electrolytes, blood sugar, iron levels, vitamin D, B12, folate, and thyroid function. - Encouraged balanced diet and consideration of nutritional supplements like Ensure if needed. - Advised monitoring weight at home periodically. - Discussed importance of vaccinations, including tetanus and flu vaccines.  Depression Well-managed with escitalopram , resulting in improved mood and quality of life. - Continue escitalopram  as prescribed.  Celiac disease Mildly borderline celiac disease with no current symptoms or dietary non-compliance. No signs of flare-up. - Continue current dietary management.  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 Condition appears well-controlled. - Ordered inflammatory markers (ESR and CRP) to monitor for potential flare-ups.       Medications Discontinued During This Encounter  Medication Reason   escitalopram  (LEXAPRO ) 10 MG tablet Reorder    Patient Counseling(The following topics were reviewed and/or handout was given):  -Nutrition: Stressed importance of moderation in sodium/caffeine intake, saturated fat and cholesterol, caloric balance, sufficient intake of fresh fruits, vegetables, and fiber.  -Stressed the importance of regular exercise.   -Substance Abuse: Discussed cessation/primary prevention of tobacco, alcohol, or other drug use; driving or other dangerous activities under the influence; availability of treatment for abuse.   -Injury prevention:  Discussed safety belts, safety helmets, smoke detector, smoking near bedding or upholstery.   -Sexuality: Discussed sexually transmitted diseases, partner selection, use of condoms, avoidance of unintended pregnancy and contraceptive alternatives.   -Dental health: Discussed importance of regular tooth brushing, flossing, and dental visits.  -Health maintenance and immunizations reviewed. Please refer to Health maintenance section.  Return in about 3 months (around 10/06/2024) for mood, weight.        Subjective:   Encounter date: 07/06/2024  Chief Complaint  Patient presents with   Annual Exam    Pt is fasting today  HM due- vaccinations ( Pt declined)     Discussed the use of AI scribe software for clinical note transcription with the patient, who gave verbal consent to proceed.  History of Present Illness           Depression and Anxiety - Managed with escitalopram  10 mg daily.       07/06/2024    2:29 PM 05/25/2024    1:50 PM 12/10/2022    7:56 AM 08/22/2022    8:18 AM 07/14/2022    9:36 AM  Depression screen PHQ 2/9  Decreased Interest 1 3 1 1 1   Down, Depressed, Hopeless 1 2 1 1 1   PHQ - 2 Score 2 5 2 2 2   Altered sleeping 0 0 0 0 1  Tired, decreased energy 1 0 1 1 1   Change in appetite 0 0 0 1 2  Feeling bad or failure about yourself  1 2 1 2 2   Trouble concentrating 1 1 1  0 1  Moving slowly or fidgety/restless 0 2 0 1 2  Suicidal thoughts 0 1 1 0 1  PHQ-9 Score 5 11  6  7  12    Difficult doing work/chores Not difficult at all Somewhat difficult Somewhat difficult Not difficult at all Somewhat difficult     Data saved  with a previous flowsheet row definition       07/06/2024    2:30 PM 05/25/2024    1:52 PM 12/10/2022    7:58 AM 08/22/2022    8:20 AM  GAD 7 : Generalized Anxiety Score  Nervous, Anxious, on Edge 1 1 1 1   Control/stop worrying 0 1 2 2   Worry too much - different things 1 0 2 2  Trouble relaxing 0 0 0 0  Restless 0 1 0 0  Easily  annoyed or irritable 1 0 1 1  Afraid - awful might happen 0 1 1 1   Total GAD 7 Score 3 4 7 7   Anxiety Difficulty Not difficult at all Not difficult at all Somewhat difficult Not difficult at all    Health Maintenance Due  Topic Date Due   Hepatitis C Screening  Never done    PMH:  The following were reviewed and entered/updated in epic: Past Medical History:  Diagnosis Date   High risk medication use 07/08/2016   Juvenile rheumatoid arthritis (HCC) 2006    Patient Active Problem List   Diagnosis Date Noted   Anxiety 12/10/2022   Celiac disease 12/10/2022   Unintentional weight loss 07/14/2022   Depression, major, single episode, moderate (HCC) 04/11/2022   Suicidal ideation 04/11/2022   Diarrhea 04/11/2022   JIA (juvenile idiopathic arthritis), oligoarthritis, 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 No pertinent surgical history.  Family History  Problem Relation Age of Onset   Hypertension Mother     Medications- reviewed and updated Outpatient Medications Prior to Visit  Medication Sig Dispense Refill   escitalopram  (LEXAPRO ) 10 MG tablet Take 1 tablet (10 mg total) by mouth daily. 90 tablet 0   No facility-administered medications prior to visit.     No Known Allergies  Social History   Socioeconomic History   Marital status: Single    Spouse name: Not on file   Number of children: Not on file   Years of education: Not on file   Highest education level: Not on file  Occupational History   Not on file  Tobacco Use   Smoking status: Never    Passive exposure: Never   Smokeless tobacco: Never  Vaping Use   Vaping status: Every Day  Substance and Sexual Activity   Alcohol use: Never   Drug use: Never   Sexual activity: Not Currently    Birth control/protection: None  Other Topics Concern   Not on file  Social History Narrative   Not on file   Social Drivers of Health   Financial Resource Strain: Not on file  Food Insecurity: Not on  file  Transportation Needs: Not on file  Physical Activity: Not on file  Stress: Not on file  Social Connections: Unknown (01/05/2022)   Received from [facility]   Social Network    Social Network: Not on file           Objective:  Physical Exam: BP 108/61 (BP Location: Left Arm, Patient Position: Sitting, Cuff Size: Normal)   Pulse 75   Temp (!) 97.5 F (36.4 C) (Temporal)   Resp 18   Wt 125 lb 6.4 oz (56.9 kg)   SpO2 99%   BMI 19.64 kg/m   Body mass index is 19.64 kg/m. Wt Readings from Last 3 Encounters:  07/06/24 125 lb 6.4 oz (56.9 kg)  06/23/23 138 lb (62.6 kg)  03/20/23 124 lb (56.2 kg)    Physical Exam  Physical Exam Constitutional:      General: He is not in acute distress.    Appearance: Normal appearance. He is not ill-appearing or toxic-appearing.  HENT:     Head: Normocephalic and atraumatic.     Right Ear: Hearing, tympanic membrane, ear canal and external ear normal. There is no impacted cerumen.     Left Ear: Hearing, tympanic membrane, ear canal and external ear normal. There is no impacted cerumen.     Nose: Nose normal. No congestion.     Mouth/Throat:     Lips: No lesions.     Mouth: Mucous membranes are moist.     Pharynx: Oropharynx is clear. No oropharyngeal exudate.  Eyes:     General: No scleral icterus.       Right eye: No discharge.        Left eye: No discharge.     Conjunctiva/sclera: Conjunctivae normal.     Pupils: Pupils are equal, round, and reactive to light.  Neck:     Thyroid: No thyroid mass, thyromegaly or thyroid tenderness.  Cardiovascular:     Rate and Rhythm: Normal rate and regular rhythm.     Pulses: Normal pulses.     Heart sounds: Normal heart sounds.  Pulmonary:     Effort: Pulmonary effort is normal. No respiratory distress.     Breath sounds: Normal breath sounds.  Abdominal:     General: Abdomen is flat. Bowel sounds are normal.     Palpations: Abdomen is soft.  Musculoskeletal:         General: Normal range of motion.     Cervical back: Normal range of motion.     Right lower leg: No edema.     Left lower leg: No edema.  Lymphadenopathy:     Cervical: No cervical adenopathy.  Skin:    General: Skin is warm and dry.     Findings: No rash.  Neurological:     General: No focal deficit present.     Mental Status: He is alert and oriented to person, place, and time. Mental status is at baseline.     Deep Tendon Reflexes:     Reflex Scores:      Patellar reflexes are 2+ on the right side and 2+ on the left side. Psychiatric:        Mood and Affect: Mood normal.        Behavior: Behavior normal.        Thought Content: Thought content normal.        Judgment: Judgment normal.         Prior labs:   No results found for this or any previous visit (from the past 2160 hours).  No results found for: CHOL No results found for: HDL No results found for: LDLCALC No results found for: TRIG No results found for: CHOLHDL No results found for: LDLDIRECT  Last metabolic panel Lab Results  Component Value Date   GLUCOSE 61 (L) 07/14/2022   NA 140 07/14/2022   K 3.3 (L) 07/14/2022   CL 103 07/14/2022   CO2 28 07/14/2022   BUN 13 07/14/2022   CREATININE 0.77 07/14/2022   GFR 129.16 07/14/2022   CALCIUM 9.7 07/14/2022   PROT 7.8 07/14/2022   ALBUMIN 4.9 07/14/2022   BILITOT 0.6 07/14/2022   ALKPHOS 63 07/14/2022   AST 13 07/14/2022   ALT 12 07/14/2022    No results found for: HGBA1C  Last CBC Lab Results  Component Value Date  WBC 3.7 (L) 07/14/2022   HGB 15.1 07/14/2022   HCT 43.6 07/14/2022   MCV 88.8 07/14/2022   RDW 12.8 07/14/2022   PLT 211.0 07/14/2022    Lab Results  Component Value Date   TSH 0.95 07/14/2022    No results found for: PSA1, PSA  Last vitamin D No results found for: 25OHVITD2, 25OHVITD3, VD25OH  No results found for: COLORU, CLARITYU, GLUCOSEUR, BILIRUBINUR, KETONESU, SPECGRAV,  RBCUR, PHUR, PROTEINUR, UROBILINOGEN, LEUKOCYTESUR  No results found for: LABMICR, MICROALBUR   At today's visit, we discussed treatment options, associated risk and benefits, and engage in counseling as needed.  Additionally the following were reviewed: Past medical records, past medical and surgical history, family and social background, as well as relevant laboratory results, imaging findings, and specialty notes, where applicable.  This message was generated using dictation software, and as a result, it may contain unintentional typos or errors.  Nevertheless, extensive effort was made to accurately convey at the pertinent aspects of the patient visit.    There may have been are other unrelated non-urgent complaints, but due to the busy schedule and the amount of time already spent with him, time does not permit to address these issues at today's visit. Another appointment may have or has been requested to review these additional issues.     Beverley KATHEE Hummer, MD  I,Emily Lagle,acting as a scribe for Beverley KATHEE Hummer, MD.,have documented all relevant documentation on the behalf of Beverley KATHEE Hummer, MD.  LILLETTE Beverley KATHEE Hummer, MD, have reviewed all documentation for this visit. The documentation on 07/06/2024 for the exam, diagnosis, procedures, and orders are all accurate and complete.

## 2024-07-07 LAB — CBC WITH DIFFERENTIAL/PLATELET
Basophils Absolute: 0.1 K/uL (ref 0.0–0.1)
Basophils Relative: 1.1 % (ref 0.0–3.0)
Eosinophils Absolute: 0.1 K/uL (ref 0.0–0.7)
Eosinophils Relative: 1.8 % (ref 0.0–5.0)
HCT: 44.7 % (ref 39.0–52.0)
Hemoglobin: 15.3 g/dL (ref 13.0–17.0)
Lymphocytes Relative: 34.1 % (ref 12.0–46.0)
Lymphs Abs: 1.6 K/uL (ref 0.7–4.0)
MCHC: 34.2 g/dL (ref 30.0–36.0)
MCV: 90.3 fl (ref 78.0–100.0)
Monocytes Absolute: 0.4 K/uL (ref 0.1–1.0)
Monocytes Relative: 8.1 % (ref 3.0–12.0)
Neutro Abs: 2.6 K/uL (ref 1.4–7.7)
Neutrophils Relative %: 54.9 % (ref 43.0–77.0)
Platelets: 219 K/uL (ref 150.0–400.0)
RBC: 4.95 Mil/uL (ref 4.22–5.81)
RDW: 13.2 % (ref 11.5–15.5)
WBC: 4.7 K/uL (ref 4.0–10.5)

## 2024-07-07 LAB — COMPREHENSIVE METABOLIC PANEL WITH GFR
ALT: 9 U/L (ref 0–53)
AST: 12 U/L (ref 0–37)
Albumin: 5 g/dL (ref 3.5–5.2)
Alkaline Phosphatase: 59 U/L (ref 39–117)
BUN: 9 mg/dL (ref 6–23)
CO2: 29 meq/L (ref 19–32)
Calcium: 9.2 mg/dL (ref 8.4–10.5)
Chloride: 102 meq/L (ref 96–112)
Creatinine, Ser: 0.67 mg/dL (ref 0.40–1.50)
GFR: 132.84 mL/min (ref >60.00)
Glucose, Bld: 81 mg/dL (ref 70–99)
Potassium: 3.5 meq/L (ref 3.5–5.1)
Sodium: 141 meq/L (ref 135–145)
Total Bilirubin: 0.7 mg/dL (ref 0.2–1.2)
Total Protein: 7.8 g/dL (ref 6.0–8.3)

## 2024-07-07 LAB — LIPID PANEL
Cholesterol: 153 mg/dL (ref 0–200)
HDL: 50.5 mg/dL (ref >39.00)
LDL Cholesterol: 90 mg/dL (ref 0–99)
NonHDL: 102.43
Total CHOL/HDL Ratio: 3
Triglycerides: 61 mg/dL (ref 0.0–149.0)
VLDL: 12.2 mg/dL (ref 0.0–40.0)

## 2024-07-07 LAB — SEDIMENTATION RATE: Sed Rate: 3 mm/h (ref 0–15)

## 2024-07-07 LAB — MICROALBUMIN / CREATININE URINE RATIO
Creatinine,U: 129.2 mg/dL
Microalb Creat Ratio: UNDETERMINED mg/g (ref 0.0–30.0)
Microalb, Ur: <0.7 mg/dL

## 2024-07-07 LAB — B12 AND FOLATE PANEL
Folate: 11.1 ng/mL (ref >5.9)
Vitamin B-12: 404 pg/mL (ref 211–911)

## 2024-07-07 LAB — TSH: TSH: 0.97 u[IU]/mL (ref 0.35–5.50)

## 2024-07-07 LAB — HEMOGLOBIN A1C: Hgb A1c MFr Bld: 5 % (ref 4.6–6.5)

## 2024-07-07 LAB — C-REACTIVE PROTEIN: CRP: <0.5 mg/dL (ref 0.5–20.0)

## 2024-07-09 LAB — URINALYSIS W MICROSCOPIC + REFLEX CULTURE
Bacteria, UA: NONE SEEN /HPF
Bilirubin Urine: NEGATIVE
Glucose, UA: NEGATIVE
Hgb urine dipstick: NEGATIVE
Hyaline Cast: NONE SEEN /LPF
Ketones, ur: NEGATIVE
Leukocyte Esterase: NEGATIVE
Nitrites, Initial: NEGATIVE
Protein, ur: NEGATIVE
RBC / HPF: NONE SEEN /HPF (ref 0–2)
Specific Gravity, Urine: 1.017 (ref 1.001–1.035)
Squamous Epithelial / HPF: NONE SEEN /HPF (ref <=5)
WBC, UA: NONE SEEN /HPF (ref 0–5)
pH: 8 (ref 5.0–8.0)

## 2024-07-09 LAB — IRON,TIBC AND FERRITIN PANEL
%SAT: 39 % (ref 20–48)
Ferritin: 59 ng/mL (ref 38–380)
Iron: 109 ug/dL (ref 50–195)
TIBC: 279 ug/dL (ref 250–425)

## 2024-07-09 LAB — NO CULTURE INDICATED

## 2024-07-09 LAB — VITAMIN D 1,25 DIHYDROXY
Vitamin D 1, 25 (OH)2 Total: 50 pg/mL (ref 18–72)
Vitamin D2 1, 25 (OH)2: <8 pg/mL
Vitamin D3 1, 25 (OH)2: 50 pg/mL

## 2024-07-13 ENCOUNTER — Ambulatory Visit: Payer: Self-pay | Admitting: Family Medicine
